# Patient Record
Sex: Male | Born: 1935 | Race: White | Hispanic: No | Marital: Married | Smoking: Former smoker
Health system: Southern US, Community
[De-identification: ages and names within clinical notes are randomized; demographics above are authoritative.]

## PROBLEM LIST (undated history)

## (undated) DIAGNOSIS — I639 Cerebral infarction, unspecified: Secondary | ICD-10-CM

## (undated) DIAGNOSIS — E7800 Pure hypercholesterolemia, unspecified: Secondary | ICD-10-CM

## (undated) DIAGNOSIS — I10 Essential (primary) hypertension: Secondary | ICD-10-CM

## (undated) HISTORY — PX: CAROTID ENDARTERECTOMY: SUR193

---

## 2016-05-18 ENCOUNTER — Emergency Department (HOSPITAL_COMMUNITY): Payer: Medicare HMO

## 2016-05-18 ENCOUNTER — Encounter (HOSPITAL_COMMUNITY): Payer: Self-pay

## 2016-05-18 ENCOUNTER — Emergency Department (HOSPITAL_COMMUNITY)
Admission: EM | Admit: 2016-05-18 | Discharge: 2016-05-18 | Disposition: A | Discharge status: Home / Self Care | Payer: Medicare HMO | Attending: Emergency Medicine | Admitting: Emergency Medicine

## 2016-05-18 DIAGNOSIS — G459 Transient cerebral ischemic attack, unspecified: Secondary | ICD-10-CM

## 2016-05-18 DIAGNOSIS — I6522 Occlusion and stenosis of left carotid artery: Secondary | ICD-10-CM

## 2016-05-18 DIAGNOSIS — R410 Disorientation, unspecified: Secondary | ICD-10-CM | POA: Diagnosis present

## 2016-05-18 DIAGNOSIS — I10 Essential (primary) hypertension: Secondary | ICD-10-CM | POA: Diagnosis not present

## 2016-05-18 DIAGNOSIS — Z8673 Personal history of transient ischemic attack (TIA), and cerebral infarction without residual deficits: Secondary | ICD-10-CM | POA: Diagnosis not present

## 2016-05-18 DIAGNOSIS — Z87891 Personal history of nicotine dependence: Secondary | ICD-10-CM | POA: Diagnosis not present

## 2016-05-18 HISTORY — DX: Pure hypercholesterolemia, unspecified: E78.00

## 2016-05-18 HISTORY — DX: Cerebral infarction, unspecified: I63.9

## 2016-05-18 HISTORY — DX: Essential (primary) hypertension: I10

## 2016-05-18 LAB — COMPREHENSIVE METABOLIC PANEL
ALT: 21 U/L (ref 17–63)
ANION GAP: 7 (ref 5–15)
AST: 20 U/L (ref 15–41)
Albumin: 3.6 g/dL (ref 3.5–5.0)
Alkaline Phosphatase: 54 U/L (ref 38–126)
BILIRUBIN TOTAL: 0.8 mg/dL (ref 0.3–1.2)
BUN: 14 mg/dL (ref 6–20)
CO2: 25 mmol/L (ref 22–32)
Calcium: 8.9 mg/dL (ref 8.9–10.3)
Chloride: 109 mmol/L (ref 101–111)
Creatinine, Ser: 1 mg/dL (ref 0.61–1.24)
GFR calc non Af Amer: >60 mL/min (ref >60)
Glucose, Bld: 139 mg/dL — ABNORMAL HIGH (ref 65–99)
POTASSIUM: 3.6 mmol/L (ref 3.5–5.1)
Sodium: 141 mmol/L (ref 135–145)
TOTAL PROTEIN: 6.3 g/dL — AB (ref 6.5–8.1)

## 2016-05-18 LAB — DIFFERENTIAL
BASOS PCT: 0 %
Basophils Absolute: 0 10*3/uL (ref 0.0–0.1)
EOS ABS: 0.1 10*3/uL (ref 0.0–0.7)
EOS PCT: 1 %
LYMPHS ABS: 1.3 10*3/uL (ref 0.7–4.0)
Lymphocytes Relative: 19 %
MONO ABS: 0.4 10*3/uL (ref 0.1–1.0)
MONOS PCT: 6 %
Neutro Abs: 4.9 10*3/uL (ref 1.7–7.7)
Neutrophils Relative %: 74 %

## 2016-05-18 LAB — I-STAT TROPONIN, ED: TROPONIN I, POC: 0 ng/mL (ref 0.00–0.08)

## 2016-05-18 LAB — APTT: aPTT: 34 seconds (ref 24–36)

## 2016-05-18 LAB — CBC
HEMATOCRIT: 42 % (ref 39.0–52.0)
HEMOGLOBIN: 14.3 g/dL (ref 13.0–17.0)
MCH: 31.8 pg (ref 26.0–34.0)
MCHC: 34 g/dL (ref 30.0–36.0)
MCV: 93.5 fL (ref 78.0–100.0)
Platelets: 178 10*3/uL (ref 150–400)
RBC: 4.49 MIL/uL (ref 4.22–5.81)
RDW: 13.6 % (ref 11.5–15.5)
WBC: 6.7 10*3/uL (ref 4.0–10.5)

## 2016-05-18 LAB — PROTIME-INR
INR: 1.32
Prothrombin Time: 16.5 seconds — ABNORMAL HIGH (ref 11.4–15.2)

## 2016-05-18 MED ORDER — GADOBENATE DIMEGLUMINE 529 MG/ML IV SOLN
15.0000 mL | Freq: Once | INTRAVENOUS | Status: AC | PRN
  Administered 2016-05-18: 15 mL via INTRAVENOUS

## 2016-05-18 NOTE — ED Notes (Signed)
Two unsuccessful attempts at IV access. Gabe, RN at bedside trying to get an IV started with blood draw.

## 2016-05-18 NOTE — Discharge Instructions (Signed)
Continue your current medications, follow up with your doctor to discuss the MRI findings today.  Fortunately there is no sign of stroke but there is narrowing of the internal carotid artery on the left side.  This may be something your doctor is already aware of.  Return as needed for worsening symptoms

## 2016-05-18 NOTE — ED Notes (Signed)
Per MRI, they are having injector issues so having to do MRI of neck without contrast at this time.  If patient ends up being admitted and MRI with contrast is needed then he can be brought down for another MRI.

## 2016-05-18 NOTE — ED Provider Notes (Signed)
[facility]-EMERGENCY DEPT Provider Note   CSN: [id number] Arrival date & time: 05/18/16  1029     History   Chief Complaint Chief Complaint  Patient presents with  . confusion x 1 day    HPI Ernest Medina is a 82 y.o. male.  HPI Pt has a history of a stroke in October.  It sounds like he had some type of an acute occlusion that was treated with some type of emergency procedure. As best as we can tell, it sounds like the patient had an occlusion of one of the cerebral vessels and he had some type of vascular directed therapy .  Since then he has had some trouble with his speech.  He has history of carotid artery occlusion that was treated with surgery.  When he had his stroke in the past he had a rushing sensation in his ear. He started having that again last night.   His daughter thinks his speech has been a little off.  No specific trouble with weakness.  They're concerned that the symptoms are associated with his prior stroke and they brought him in for evaluation.  Past Medical History:  Diagnosis Date  . High cholesterol   . Hypertension   . Stroke [facility])     There are no active problems to display for this patient.   Past Surgical History:  Procedure Laterality Date  . CAROTID ENDARTERECTOMY         Home Medications    Pt does take Brilenta  Family History No family history on file.  Social History Social History  Substance Use Topics  . Smoking status: Former Games developer  . Smokeless tobacco: Never Used  . Alcohol use Not on file     Allergies   Patient has no known allergies.   Review of Systems Review of Systems  All other systems reviewed and are negative.    Physical Exam Updated Vital Signs BP 171/90   Pulse 64   Temp 98.3 F (36.8 C)   Resp 24   SpO2 96%   Physical Exam  Constitutional: He is oriented to person, place, and time. He appears well-developed and well-nourished. No distress.  HENT:  Head: Normocephalic and atraumatic.  Right Ear:  External ear normal.  Left Ear: External ear normal.  Mouth/Throat: Oropharynx is clear and moist.  Eyes: Conjunctivae are normal. Right eye exhibits no discharge. Left eye exhibits no discharge. No scleral icterus.  Neck: Neck supple. No tracheal deviation present.  Carotid bruits bilaterally  Cardiovascular: Normal rate, regular rhythm and intact distal pulses.   Pulmonary/Chest: Effort normal and breath sounds normal. No stridor. No respiratory distress. He has no wheezes. He has no rales.  Abdominal: Soft. Bowel sounds are normal. He exhibits no distension. There is no tenderness. There is no rebound and no guarding.  Musculoskeletal: He exhibits no edema or tenderness.  Neurological: He is alert and oriented to person, place, and time. He has normal strength. No cranial nerve deficit (No facial droop, extraocular movements intact, tongue midline ) or sensory deficit. He exhibits normal muscle tone. He displays no seizure activity. Coordination normal.  No pronator drift bilateral upper extrem, able to hold both legs off bed for 5 seconds, sensation intact in all extremities, no visual field cuts, no left or right sided neglect, normal finger-nose exam bilaterally, no nystagmus noted   Skin: Skin is warm and dry. No rash noted.  Psychiatric: He has a normal mood and affect.  Nursing note and vitals  reviewed.    ED Treatments / Results  Labs (all labs ordered are listed, but only abnormal results are displayed) Labs Reviewed  PROTIME-INR - Abnormal; Notable for the following:       Result Value   Prothrombin Time 16.5 (*)    All other components within normal limits  COMPREHENSIVE METABOLIC PANEL - Abnormal; Notable for the following:    Glucose, Bld 139 (*)    Total Protein 6.3 (*)    All other components within normal limits  APTT  CBC  DIFFERENTIAL  I-STAT TROPOININ, ED    EKG  EKG Interpretation  Date/Time:  Sunday May 18 2016 12:15:01 EST Ventricular Rate:  66 PR  Interval:    QRS Duration: 94 QT Interval:  404 QTC Calculation: 424 R Axis:   17 Text Interpretation:  Sinus rhythm No old tracing to compare Confirmed by Naara Kelty  MD-J, Uriel Horkey ([id number]) on 05/18/2016 12:31:37 PM       Radiology Ct Head Wo Contrast  Result Date: 05/18/2016 CLINICAL DATA:  Confusion since yesterday.  History of stroke. EXAM: CT HEAD WITHOUT CONTRAST TECHNIQUE: Contiguous axial images were obtained from the base of the skull through the vertex without intravenous contrast. COMPARISON:  None. FINDINGS: Brain: The brain demonstrates moderate cerebral atrophy and small vessel disease in the periventricular white matter. No evidence of hemorrhage, acute infarction, edema, mass effect, extra-axial fluid collection, hydrocephalus or mass lesion. Vascular: No hyperdense vessel or unexpected calcification. Skull: Normal. Negative for fracture or focal lesion. Sinuses/Orbits: No acute finding. Other: None. IMPRESSION: Atrophy and small vessel disease.  No acute findings. Electronically Signed   By: Irish LackGlenn  Yamagata M.D.   On: 05/18/2016 12:06   Mr Maxine GlennMra Head Wo Contrast  Result Date: 05/18/2016 CLINICAL DATA:  Confusion and slow response.  Carotid bruits. EXAM: MRI HEAD WITHOUT CONTRAST MRA HEAD WITHOUT CONTRAST MRA NECK WITHOUT CONTRAST TECHNIQUE: Multiplanar, multiecho pulse sequences of the brain and surrounding structures were obtained without intravenous contrast. Angiographic images of the Circle of Willis were obtained using MRA technique without intravenous contrast. Angiographic images of the neck were obtained using MRA technique without intravenous contrast. Carotid stenosis measurements (when applicable) are obtained utilizing NASCET criteria, using the distal internal carotid diameter as the denominator. COMPARISON:  Head CT from earlier today FINDINGS: MRI HEAD FINDINGS Brain: No acute infarction, hemorrhage, hydrocephalus, extra-axial collection or mass lesion. Small remote left lateral  temporal and parietal cortex infarcts. Mild for age microvascular ischemic change in the cerebral white matter. Vascular: Normal dural venous sinus flow voids. Arterial findings below. Skull and upper cervical spine: No marrow lesion noted. Sinuses/Orbits: Bilateral cataract resection.  No acute finding. MRA HEAD FINDINGS Symmetric carotid and vertebral arteries. No major branch occlusion or flow limiting stenosis. No beading or aneurysm noted. Hypoplastic left A1 segment with anterior communicating artery present. No visible posterior communicating arteries. MRA NECK FINDINGS Limited due to artifact. Tandem high-grade narrowings of the proximal and mid left ICA. Proximal left ECA is also stenotic. Altered flow of the right cervical carotid bifurcation without flow limiting stenosis. Poor visualization vertebral arteries at the bilateral V3 V4 junction due to direction of flow. No identifiable vertebral stenosis. Limited evaluation of the arch and great vessels artifact. IMPRESSION: 1. No acute intracranial finding, including infarct. 2. 2 small remote left MCA territory infarcts. 3. Negative intracranial MRA. 4. Tandem high-grade stenosis in the left proximal and mid cervical ICA. Electronically Signed   By: Marnee SpringJonathon  Watts M.D.   On: 05/18/2016 16:38  Mr Maxine Glenn Neck Wo Contrast  Result Date: 05/18/2016 CLINICAL DATA:  Confusion and slow response.  Carotid bruits. EXAM: MRI HEAD WITHOUT CONTRAST MRA HEAD WITHOUT CONTRAST MRA NECK WITHOUT CONTRAST TECHNIQUE: Multiplanar, multiecho pulse sequences of the brain and surrounding structures were obtained without intravenous contrast. Angiographic images of the Circle of Willis were obtained using MRA technique without intravenous contrast. Angiographic images of the neck were obtained using MRA technique without intravenous contrast. Carotid stenosis measurements (when applicable) are obtained utilizing NASCET criteria, using the distal internal carotid diameter as the  denominator. COMPARISON:  Head CT from earlier today FINDINGS: MRI HEAD FINDINGS Brain: No acute infarction, hemorrhage, hydrocephalus, extra-axial collection or mass lesion. Small remote left lateral temporal and parietal cortex infarcts. Mild for age microvascular ischemic change in the cerebral white matter. Vascular: Normal dural venous sinus flow voids. Arterial findings below. Skull and upper cervical spine: No marrow lesion noted. Sinuses/Orbits: Bilateral cataract resection.  No acute finding. MRA HEAD FINDINGS Symmetric carotid and vertebral arteries. No major branch occlusion or flow limiting stenosis. No beading or aneurysm noted. Hypoplastic left A1 segment with anterior communicating artery present. No visible posterior communicating arteries. MRA NECK FINDINGS Limited due to artifact. Tandem high-grade narrowings of the proximal and mid left ICA. Proximal left ECA is also stenotic. Altered flow of the right cervical carotid bifurcation without flow limiting stenosis. Poor visualization vertebral arteries at the bilateral V3 V4 junction due to direction of flow. No identifiable vertebral stenosis. Limited evaluation of the arch and great vessels artifact. IMPRESSION: 1. No acute intracranial finding, including infarct. 2. 2 small remote left MCA territory infarcts. 3. Negative intracranial MRA. 4. Tandem high-grade stenosis in the left proximal and mid cervical ICA. Electronically Signed   By: Marnee Spring M.D.   On: 05/18/2016 16:38   Mr Brain Wo Contrast  Result Date: 05/18/2016 CLINICAL DATA:  Confusion and slow response.  Carotid bruits. EXAM: MRI HEAD WITHOUT CONTRAST MRA HEAD WITHOUT CONTRAST MRA NECK WITHOUT CONTRAST TECHNIQUE: Multiplanar, multiecho pulse sequences of the brain and surrounding structures were obtained without intravenous contrast. Angiographic images of the Circle of Willis were obtained using MRA technique without intravenous contrast. Angiographic images of the neck were  obtained using MRA technique without intravenous contrast. Carotid stenosis measurements (when applicable) are obtained utilizing NASCET criteria, using the distal internal carotid diameter as the denominator. COMPARISON:  Head CT from earlier today FINDINGS: MRI HEAD FINDINGS Brain: No acute infarction, hemorrhage, hydrocephalus, extra-axial collection or mass lesion. Small remote left lateral temporal and parietal cortex infarcts. Mild for age microvascular ischemic change in the cerebral white matter. Vascular: Normal dural venous sinus flow voids. Arterial findings below. Skull and upper cervical spine: No marrow lesion noted. Sinuses/Orbits: Bilateral cataract resection.  No acute finding. MRA HEAD FINDINGS Symmetric carotid and vertebral arteries. No major branch occlusion or flow limiting stenosis. No beading or aneurysm noted. Hypoplastic left A1 segment with anterior communicating artery present. No visible posterior communicating arteries. MRA NECK FINDINGS Limited due to artifact. Tandem high-grade narrowings of the proximal and mid left ICA. Proximal left ECA is also stenotic. Altered flow of the right cervical carotid bifurcation without flow limiting stenosis. Poor visualization vertebral arteries at the bilateral V3 V4 junction due to direction of flow. No identifiable vertebral stenosis. Limited evaluation of the arch and great vessels artifact. IMPRESSION: 1. No acute intracranial finding, including infarct. 2. 2 small remote left MCA territory infarcts. 3. Negative intracranial MRA. 4. Tandem high-grade stenosis in the left  proximal and mid cervical ICA. Electronically Signed   By: Marnee Spring M.D.   On: 05/18/2016 16:38    Procedures Procedures (including critical care time)  Medications Ordered in ED Medications  gadobenate dimeglumine (MULTIHANCE) injection 15 mL (15 mLs Intravenous Contrast Given 05/18/16 1441)     Initial Impression / Assessment and Plan / ED Course  I have  reviewed the triage vital signs and the nursing notes.  Pertinent labs & imaging results that were available during my care of the patient were reviewed by me and considered in my medical decision making (see chart for details).   the patient has a normal neurologic exam here in the emergency room. No definitive evidence of stroke. However considering his history of a recent stroke and the symptoms he experienced MRI and MRA was done.  Fortunately MRI of the brain does not show any evidence of acute stroke. The MRI of the head and neck does show evidence of internal carotid artery stenosis on the left.  Pt can follow up with his doctors about this when he returns home.  He is on Brilenta  Final Clinical Impressions(s) / ED Diagnoses   Final diagnoses:  Internal carotid artery stenosis, left    New Prescriptions New Prescriptions   No medications on file     Linwood Dibbles, MD 05/18/16 1706

## 2016-05-18 NOTE — ED Notes (Signed)
Patient transported to MRI 

## 2016-05-18 NOTE — ED Triage Notes (Signed)
Patient here from [place] visiting daughter. Has had previous stroke and yesterday developed increased confusion and slow to answer questions. Alert to person only on arrival. denies pain.

## 2018-03-15 IMAGING — CT CT HEAD W/O CM
4 series · 16 of 47 positions shown, 18 images · non-contrast
Comparison: None.

CLINICAL DATA: Confusion since yesterday.  History of stroke.

EXAM:
CT HEAD WITHOUT CONTRAST
TECHNIQUE: Contiguous axial images were obtained from the base of the skull
through the vertex without intravenous contrast.

[Series 2: head without · axial · non-contrast · 0.46mm/px · z∈[+1312,+1437]mm · 7 of 35 slices shown, 9 images]
[im 5/35  brain]
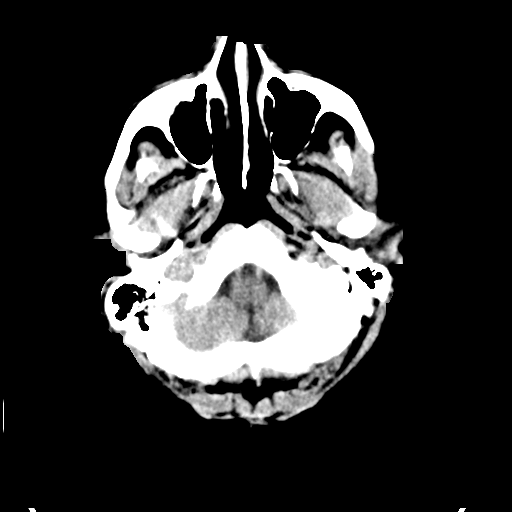
[im 5/35  bone]
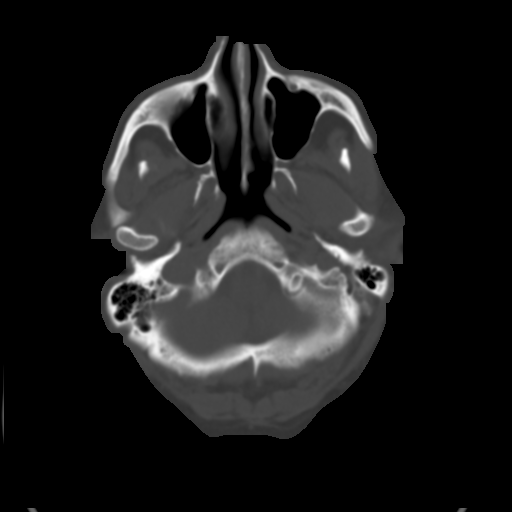
[im 9/35  brain]
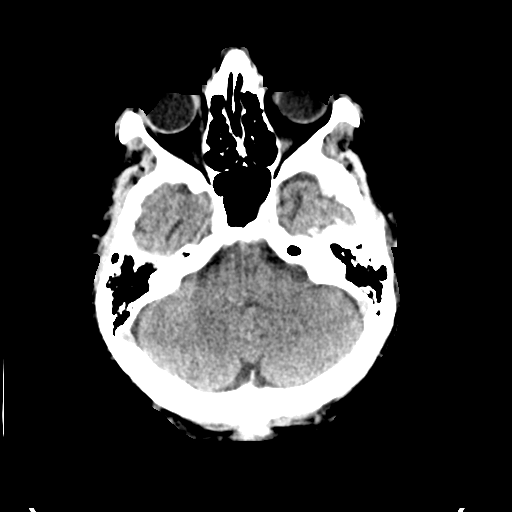
[im 13/35  brain]
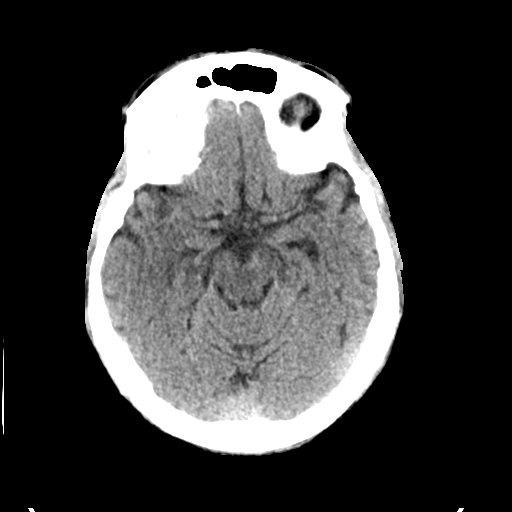
[im 18/35  brain]
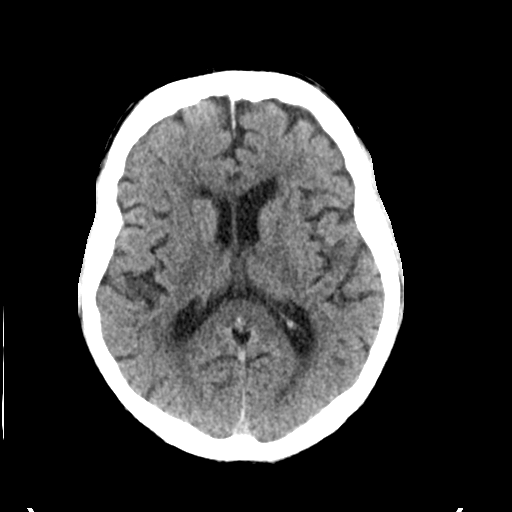
[im 22/35  brain]
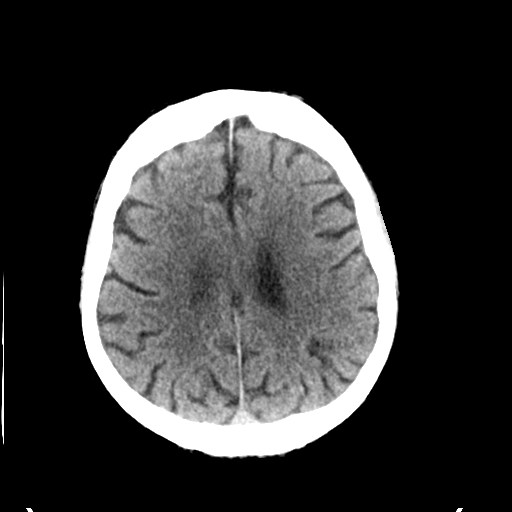
[im 22/35  bone]
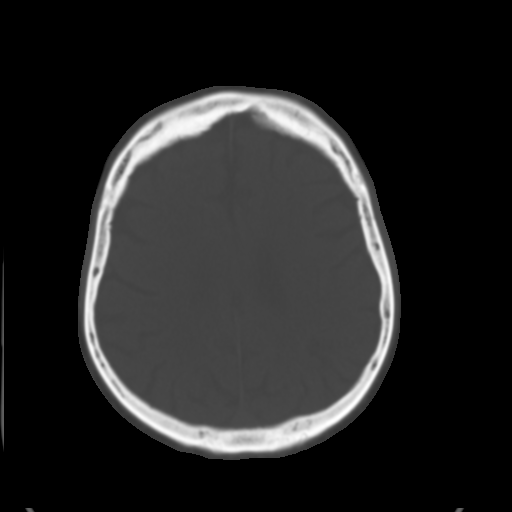
[im 26/35  brain]
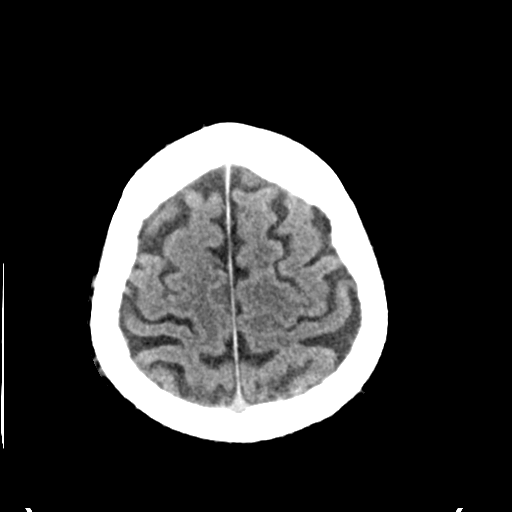
[im 30/35  brain]
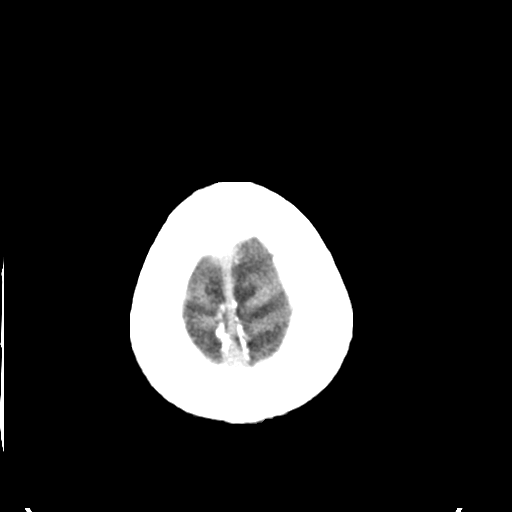

[Series 3: head bone · axial · 0.46mm/px · z∈[+1308,+1342]mm · 3 of 86 slices shown]
[im 9/86  bone]
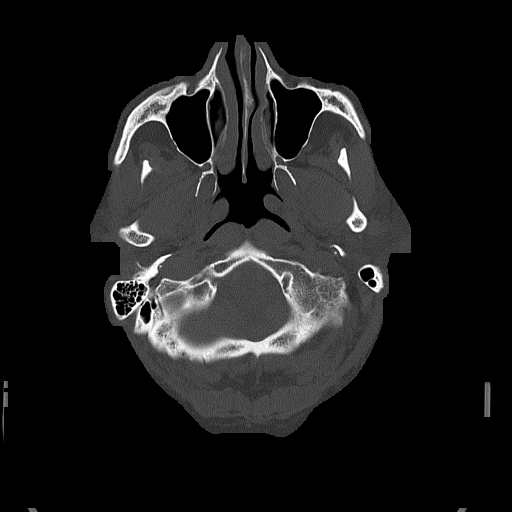
[im 18/86  bone]
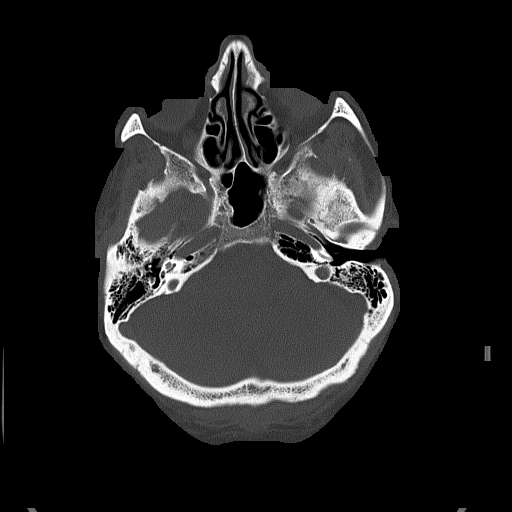
[im 26/86  bone]
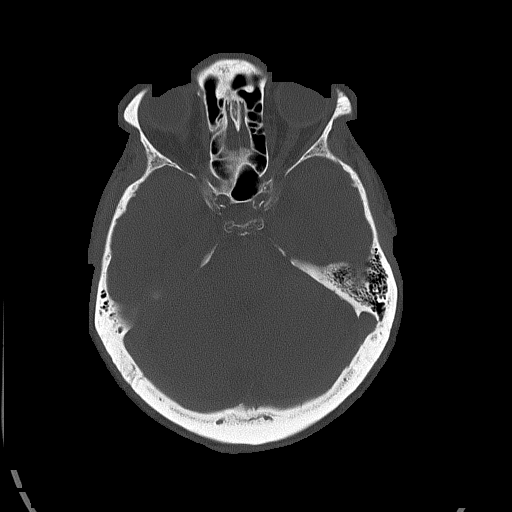

[Series 4: head without cor · coronal · non-contrast · 0.34mm/px · 3 of 74 slices shown]
[im 25/74  brain]
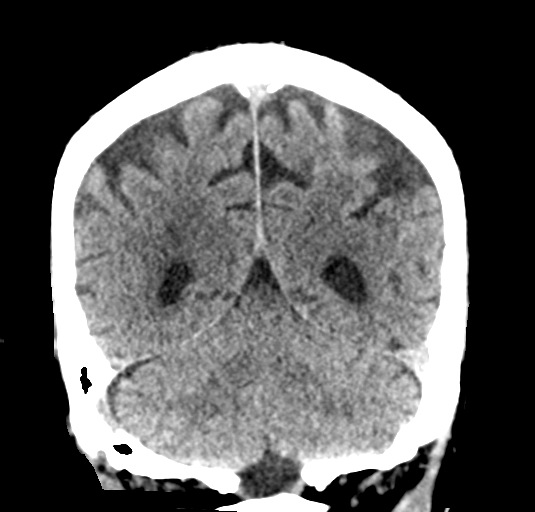
[im 33/74  brain]
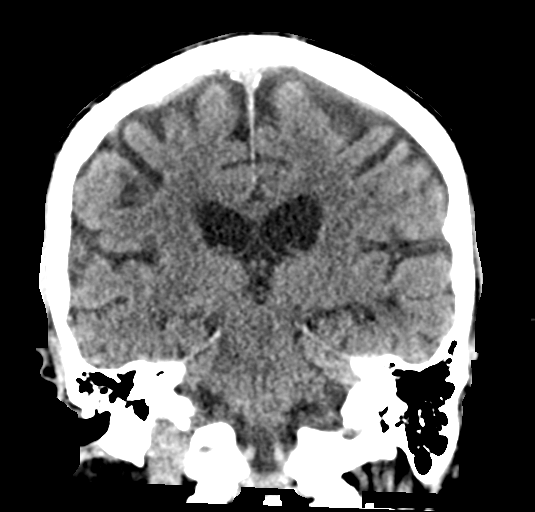
[im 41/74  brain]
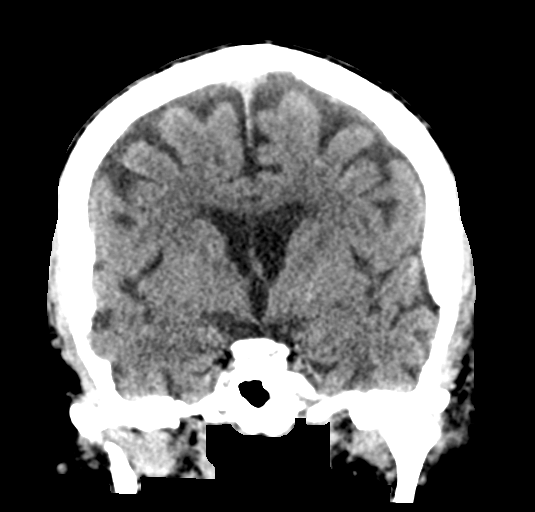

[Series 5: head without sag · sagittal · non-contrast · 0.33mm/px · 3 of 61 slices shown]
[im 21/61  brain]
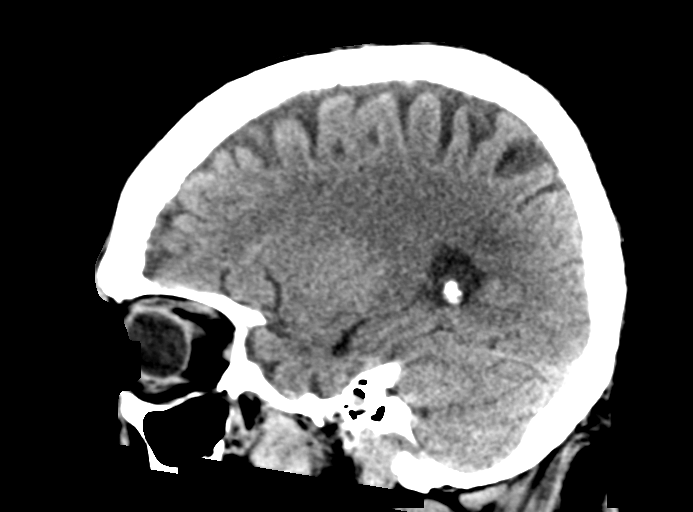
[im 31/61  brain]
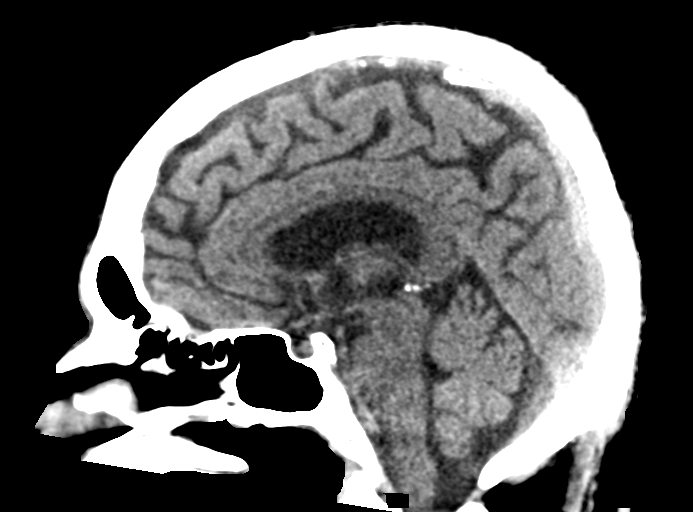
[im 41/61  brain]
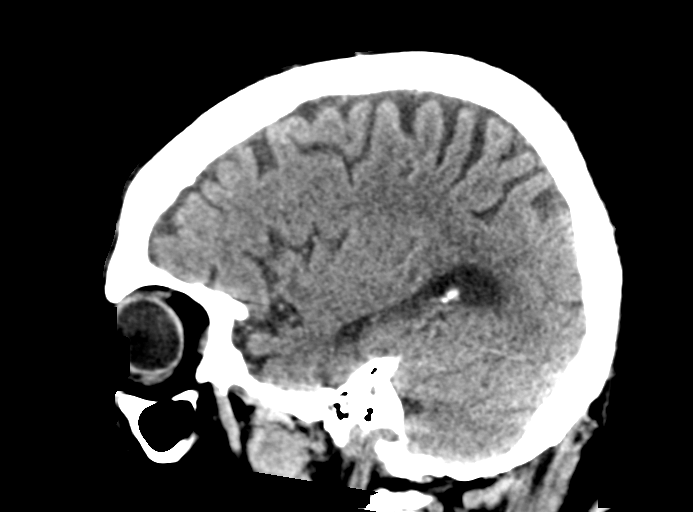

[16 of 47 positions shown; findings below may reference images not displayed]

FINDINGS: Brain: The brain demonstrates moderate cerebral atrophy and small
vessel disease in the periventricular white matter. No evidence of
hemorrhage, acute infarction, edema, mass effect, extra-axial fluid
collection, hydrocephalus or mass lesion.

Vascular: No hyperdense vessel or unexpected calcification.

Skull: Normal. Negative for fracture or focal lesion.

Sinuses/Orbits: No acute finding.

Other: None.
IMPRESSION: Atrophy and small vessel disease.  No acute findings.
